# Patient Record
Sex: Female | Born: 1995 | Race: Black or African American | Hispanic: No | Marital: Single | Smoking: Never smoker
Health system: Southern US, Community
[De-identification: ages and names within clinical notes are randomized; demographics above are authoritative.]

---
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
## 2016-05-21 ENCOUNTER — Emergency Department (HOSPITAL_COMMUNITY): Payer: Medicaid Other

## 2016-05-21 ENCOUNTER — Encounter (HOSPITAL_COMMUNITY): Payer: Self-pay

## 2016-05-21 ENCOUNTER — Emergency Department (HOSPITAL_COMMUNITY)
Admission: EM | Admit: 2016-05-21 | Discharge: 2016-05-21 | Disposition: A | Discharge status: Home / Self Care | Payer: Medicaid Other | Attending: Emergency Medicine | Admitting: Emergency Medicine

## 2016-05-21 DIAGNOSIS — N83201 Unspecified ovarian cyst, right side: Secondary | ICD-10-CM | POA: Diagnosis not present

## 2016-05-21 DIAGNOSIS — N83209 Unspecified ovarian cyst, unspecified side: Secondary | ICD-10-CM

## 2016-05-21 DIAGNOSIS — Z79899 Other long term (current) drug therapy: Secondary | ICD-10-CM | POA: Insufficient documentation

## 2016-05-21 DIAGNOSIS — R1030 Lower abdominal pain, unspecified: Secondary | ICD-10-CM | POA: Diagnosis present

## 2016-05-21 DIAGNOSIS — R102 Pelvic and perineal pain: Secondary | ICD-10-CM

## 2016-05-21 LAB — COMPREHENSIVE METABOLIC PANEL
ALBUMIN: 4.2 g/dL (ref 3.5–5.0)
ALT: 17 U/L (ref 14–54)
ANION GAP: 6 (ref 5–15)
AST: 19 U/L (ref 15–41)
Alkaline Phosphatase: 54 U/L (ref 38–126)
BILIRUBIN TOTAL: 0.5 mg/dL (ref 0.3–1.2)
BUN: 8 mg/dL (ref 6–20)
CO2: 28 mmol/L (ref 22–32)
Calcium: 9 mg/dL (ref 8.9–10.3)
Chloride: 103 mmol/L (ref 101–111)
Creatinine, Ser: 0.74 mg/dL (ref 0.44–1.00)
GFR calc non Af Amer: >60 mL/min (ref >60)
GLUCOSE: 93 mg/dL (ref 65–99)
POTASSIUM: 3.9 mmol/L (ref 3.5–5.1)
SODIUM: 137 mmol/L (ref 135–145)
TOTAL PROTEIN: 6.8 g/dL (ref 6.5–8.1)

## 2016-05-21 LAB — CBC
HEMATOCRIT: 37.8 % (ref 36.0–46.0)
HEMOGLOBIN: 12.3 g/dL (ref 12.0–15.0)
MCH: 28.7 pg (ref 26.0–34.0)
MCHC: 32.5 g/dL (ref 30.0–36.0)
MCV: 88.3 fL (ref 78.0–100.0)
Platelets: 262 10*3/uL (ref 150–400)
RBC: 4.28 MIL/uL (ref 3.87–5.11)
RDW: 13.5 % (ref 11.5–15.5)
WBC: 9.6 10*3/uL (ref 4.0–10.5)

## 2016-05-21 LAB — I-STAT BETA HCG BLOOD, ED (MC, WL, AP ONLY)

## 2016-05-21 LAB — URINALYSIS, ROUTINE W REFLEX MICROSCOPIC
BILIRUBIN URINE: NEGATIVE
Glucose, UA: NEGATIVE mg/dL
Hgb urine dipstick: NEGATIVE
Ketones, ur: NEGATIVE mg/dL
Leukocytes, UA: NEGATIVE
NITRITE: NEGATIVE
PH: 8 (ref 5.0–8.0)
Protein, ur: NEGATIVE mg/dL
SPECIFIC GRAVITY, URINE: 1.015 (ref 1.005–1.030)

## 2016-05-21 LAB — WET PREP, GENITAL
CLUE CELLS WET PREP: NONE SEEN
Sperm: NONE SEEN
TRICH WET PREP: NONE SEEN
WBC WET PREP: NONE SEEN
YEAST WET PREP: NONE SEEN

## 2016-05-21 LAB — LIPASE, BLOOD: Lipase: 23 U/L (ref 11–51)

## 2016-05-21 MED ORDER — OXYCODONE HCL 5 MG PO TABS
5.0000 mg | ORAL_TABLET | Freq: Once | ORAL | Status: AC
  Administered 2016-05-21: 5 mg via ORAL
  Filled 2016-05-21: qty 1

## 2016-05-21 MED ORDER — TRAMADOL HCL 50 MG PO TABS: 50.0000 mg | ORAL_TABLET | Freq: Four times a day (QID) | ORAL | 0 refills | Status: AC | PRN

## 2016-05-21 NOTE — Progress Notes (Signed)
entered in d/c instructions [facility]     Medicaid [place] access response hx [facility] [address] [place], [place] [phone]    Next Steps: Schedule an appointment as soon as possible for a visit today    Instructions: OR if you plan to remain in [place] Go to or call local DSS [phone] to get assist with a medicaid doctor locally for follow up care You may also call this number to get assist with medicaid transportation services

## 2016-05-21 NOTE — ED Notes (Signed)
Pt states she voided not long ago and will let us know when she has to void again

## 2016-05-21 NOTE — ED Provider Notes (Signed)
[facility]-EMERGENCY DEPT Provider Note   CSN: [id number] Arrival date & time: 05/21/16  0754     History   Chief Complaint Chief Complaint  Patient presents with  . Abdominal Pain  . Shoulder Pain    HPI Jane Bowers is a 20 y.o. female.  HPI  Pt was seen at 0955.  Per pt, c/o gradual onset and persistence of constant pelvic "pain" since "yesterday afternoon after having sex." Describes the abd pain as "aching." States the pain radiates into her lower back/buttocks area and her right shoulder. Denies N/V, no diarrhea, no fevers, no rash, no CP/SOB, no black or blood in stools, no vaginal bleeding/discharge, no dysuria/hematuria.      History reviewed. No pertinent past medical history.  Patient Active Problem List   Diagnosis Date Noted  . Peroneal tendonitis of right lower extremity 11/28/2015    History reviewed. No pertinent surgical history.    Home Medications    Prior to Admission medications   Medication Sig Start Date End Date Taking? Authorizing Provider  albuterol (PROVENTIL HFA;VENTOLIN HFA) 108 (90 Base) MCG/ACT inhaler Inhale 1-2 puffs into the lungs every 6 (six) hours as needed for wheezing or shortness of breath.    Historical Provider, MD  meloxicam (MOBIC) 15 MG tablet Take 1 tablet (15 mg total) by mouth daily. 11/28/15   Briscoe DeutscherBryan R Hess, DO    Family History History reviewed. No pertinent family history.  Social History Social History  Substance Use Topics  . Smoking status: Never Smoker  . Smokeless tobacco: Never Used  . Alcohol use No     Allergies   Chloraseptic sore throat [acetaminophen]   Review of Systems Review of Systems ROS: Statement: All systems negative except as marked or noted in the HPI; Constitutional: Negative for fever and chills. ; ; Eyes: Negative for eye pain, redness and discharge. ; ; ENMT: Negative for ear pain, hoarseness, nasal congestion, sinus pressure and sore throat. ; ; Cardiovascular: Negative for chest pain,  palpitations, diaphoresis, dyspnea and peripheral edema. ; ; Respiratory: Negative for cough, wheezing and stridor. ; ; Gastrointestinal: Negative for nausea, vomiting, diarrhea, blood in stool, hematemesis, jaundice and rectal bleeding. . ; ; Genitourinary: Negative for dysuria and hematuria. ; ; GYN:  +pelvic pain, no vaginal bleeding, no vaginal discharge, no vulvar pain. ;; Musculoskeletal: +LBP. Negative for neck pain. Negative for swelling and trauma.; ; Skin: Negative for pruritus, rash, abrasions, blisters, bruising and skin lesion.; ; Neuro: Negative for headache, lightheadedness and neck stiffness. Negative for weakness, altered level of consciousness, altered mental status, extremity weakness, paresthesias, involuntary movement, seizure and syncope.       Physical Exam Updated Vital Signs BP 106/75 (BP Location: Left Arm)   Pulse 84   Temp 97.8 F (36.6 C) (Oral)   Resp 16   LMP 04/29/2016   SpO2 100%   Physical Exam 1000: Physical examination:  Nursing notes reviewed; Vital signs and O2 SAT reviewed;  Constitutional: Well developed, Well nourished, Well hydrated, In no acute distress; Head:  Normocephalic, atraumatic; Eyes: EOMI, PERRL, No scleral icterus; ENMT: Mouth and pharynx normal, Mucous membranes moist; Neck: Supple, Full range of motion, No lymphadenopathy; Cardiovascular: Regular rate and rhythm, No murmur, rub, or gallop; Respiratory: Breath sounds clear & equal bilaterally, No rales, rhonchi, wheezes.  Speaking full sentences with ease, Normal respiratory effort/excursion; Chest: Nontender, Movement normal; Abdomen: Soft, +bilat pelvic and suprapubic areas tender to palp. No rebound or guarding. Nondistended, Normal bowel sounds; Genitourinary: No CVA tenderness.  Pelvic exam performed with permission of pt and female ED RN assist during exam.  External genitalia w/o lesions. Vaginal vault with thin white discharge.  Cervix w/o lesions, not friable, GC/chlam and wet prep  obtained and sent to lab.  Bimanual exam w/o CMT and left adnexal tenderness, +uterine and right adnexal tenderness.;; Extremities: Pulses normal, No tenderness, No edema, No calf edema or asymmetry.; Neuro: AA&Ox3, Major CN grossly intact.  Speech clear. No gross focal motor or sensory deficits in extremities.; Skin: Color normal, Warm, Dry.    ED Treatments / Results  Labs (all labs ordered are listed, but only abnormal results are displayed)   EKG  EKG Interpretation None       Radiology   Procedures Procedures (including critical care time)  Medications Ordered in ED Medications - No data to display   Initial Impression / Assessment and Plan / ED Course  I have reviewed the triage vital signs and the nursing notes.  Pertinent labs & imaging results that were available during my care of the patient were reviewed by me and considered in my medical decision making (see chart for details).  MDM Reviewed: previous chart, nursing note and vitals Reviewed previous: labs Interpretation: labs, x-ray and ultrasound   Results for orders placed or performed during the hospital encounter of 05/21/16  Wet prep, genital  Result Value Ref Range   Yeast Wet Prep HPF POC NONE SEEN NONE SEEN   Trich, Wet Prep NONE SEEN NONE SEEN   Clue Cells Wet Prep HPF POC NONE SEEN NONE SEEN   WBC, Wet Prep HPF POC NONE SEEN NONE SEEN   Sperm NONE SEEN   Lipase, blood  Result Value Ref Range   Lipase 23 11 - 51 U/L  Comprehensive metabolic panel  Result Value Ref Range   Sodium 137 135 - 145 mmol/L   Potassium 3.9 3.5 - 5.1 mmol/L   Chloride 103 101 - 111 mmol/L   CO2 28 22 - 32 mmol/L   Glucose, Bld 93 65 - 99 mg/dL   BUN 8 6 - 20 mg/dL   Creatinine, Ser 1.61 0.44 - 1.00 mg/dL   Calcium 9.0 8.9 - 09.6 mg/dL   Total Protein 6.8 6.5 - 8.1 g/dL   Albumin 4.2 3.5 - 5.0 g/dL   AST 19 15 - 41 U/L   ALT 17 14 - 54 U/L   Alkaline Phosphatase 54 38 - 126 U/L   Total Bilirubin 0.5 0.3 - 1.2  mg/dL   GFR calc non Af Amer >60 >60 mL/min   GFR calc Af Amer >60 >60 mL/min   Anion gap 6 5 - 15  CBC  Result Value Ref Range   WBC 9.6 4.0 - 10.5 K/uL   RBC 4.28 3.87 - 5.11 MIL/uL   Hemoglobin 12.3 12.0 - 15.0 g/dL   HCT 04.5 40.9 - 81.1 %   MCV 88.3 78.0 - 100.0 fL   MCH 28.7 26.0 - 34.0 pg   MCHC 32.5 30.0 - 36.0 g/dL   RDW 91.4 78.2 - 95.6 %   Platelets 262 150 - 400 K/uL  Urinalysis, Routine w reflex microscopic  Result Value Ref Range   Color, Urine YELLOW YELLOW   APPearance CLEAR CLEAR   Specific Gravity, Urine 1.015 1.005 - 1.030   pH 8.0 5.0 - 8.0   Glucose, UA NEGATIVE NEGATIVE mg/dL   Hgb urine dipstick NEGATIVE NEGATIVE   Bilirubin Urine NEGATIVE NEGATIVE   Ketones, ur NEGATIVE NEGATIVE mg/dL  Protein, ur NEGATIVE NEGATIVE mg/dL   Nitrite NEGATIVE NEGATIVE   Leukocytes, UA NEGATIVE NEGATIVE  I-Stat beta hCG blood, ED  Result Value Ref Range   I-stat hCG, quantitative <5.0 <5 mIU/mL   Comment 3           US Transvaginal Non-ob Result Date: 05/21/2016 CLINICAL DATA:  Patient with bilateral pelvic pain for 1 day. EXAM: TRANSABDOMINAL AND TRANSVAGINAL ULTRASOUND OF PELVIS DOPPLER ULTRASOUND OF OVARIES TECHNIQUE: Both transabdominal and transvaginal ultrasound examinations of the pelvis were performed. Transabdominal technique was performed for global imaging of the pelvis including uterus, ovaries, adnexal regions, and pelvic cul-de-sac. It was necessary to proceed with endovaginal exam following the transabdominal exam to visualize the adnexal structures. Color and duplex Doppler ultrasound was utilized to evaluate blood flow to the ovaries. COMPARISON:  None. FINDINGS: Uterus Measurements: 7.9 x 3.5 x 3.5 cm. No fibroids or other mass visualized. Endometrium Thickness: 12 mm.  No focal abnormality visualized. Right ovary Measurements: 10.1 x 5.2 x 8.1 cm. The right ovary is enlarged and complex in appearance with multiple areas of heterogeneity. Left ovary  Measurements: 5.4 x 2.6 x 3.7 cm. There is a 3.5 x 2.6 x 3.2 cm simple cyst within the left ovary. Pulsed Doppler evaluation of both ovaries demonstrates normal low-resistance arterial and venous waveforms. Other findings Moderate amount of complicated free fluid. IMPRESSION: The right ovary is markedly enlarged and complex in appearance with multiple areas of mixed echogenicity, suggestive of hemorrhagic cysts which have bled into the ovarian parenchyma/adnexa. Blood flow is demonstrated within the right ovary. Given the overall size of the right ovary and complexity, intermittent torsion as a cause of pain is not excluded. Patient needs follow-up pelvic ultrasound in 4- 6 weeks to assess for interval change/resolution of large complex probable hemorrhagic cystic masses within the right ovary. Left ovarian simple cyst. These results were called by telephone at the time of interpretation on 05/21/2016 at 12:08 pm to Dr. Samuel Jester , who verbally acknowledged these results. Electronically Signed   By: Annia Belt M.D.   On: 05/21/2016 12:09   Dg Abd Acute W/chest Result Date: 05/21/2016 CLINICAL DATA:  Pelvic pain beginning at 4 p.m. yesterday after intercourse. EXAM: DG ABDOMEN ACUTE W/ 1V CHEST COMPARISON:  None. FINDINGS: There is no evidence of dilated bowel loops or free intraperitoneal air. No radiopaque calculi or other significant radiographic abnormality is seen. Heart size and mediastinal contours are within normal limits. Both lungs are clear. IMPRESSION: Negative abdominal radiographs.  No acute cardiopulmonary disease. Electronically Signed   By: Drusilla Kanner M.D.   On: 05/21/2016 10:43    1305:  [place] completed. T/C to OB/GYN Dr. Debroah Loop, case discussed, including:  HPI, pertinent PM/SHx, VS/PE, dx testing, ED course and treatment: no emergent surgical issue at this time (ovary has blood flow on doppler), pain control, [facility].   1400:  Workup now complete; otherwise  reassuring. Dx and testing, as well as d/w OB/GYN, d/w pt.  Questions answered.  Verb understanding, agreeable to d/c home with outpt f/u.    Final Clinical Impressions(s) / ED Diagnoses   Final diagnoses:  Pelvic pain in female  Pelvic pain in female    New Prescriptions New Prescriptions   No medications on file     Samuel Jester, DO 05/23/16 2038

## 2016-05-21 NOTE — ED Notes (Signed)
US at bedside

## 2016-05-21 NOTE — Discharge Instructions (Signed)
Take the prescription as directed.  Call the OB/GYN doctor today to schedule a follow up appointment within the next 4 days.  Return to the Emergency Department immediately sooner if worsening.

## 2016-05-21 NOTE — ED Triage Notes (Signed)
Pt c/o lower abdominal radiating into lower back/buttcocks area and R shoulder pain starting last night.  Pain score 7/10.  Denies n/v/d.  Denies injury.  Pt reports taking ibuprofen last night w/o relief.  Sts the abdominal pain began just below umbilicus, but has "moved" to a generalized pain.

## 2016-05-21 NOTE — ED Notes (Signed)
Patient transported to Ultrasound 

## 2016-05-22 LAB — GC/CHLAMYDIA PROBE AMP (~~LOC~~) NOT AT ARMC
CHLAMYDIA, DNA PROBE: NEGATIVE
NEISSERIA GONORRHEA: NEGATIVE

## 2016-05-27 ENCOUNTER — Telehealth (HOSPITAL_COMMUNITY): Payer: Self-pay | Admitting: *Deleted

## 2016-06-09 ENCOUNTER — Telehealth: Payer: Self-pay

## 2016-06-09 DIAGNOSIS — N83209 Unspecified ovarian cyst, unspecified side: Secondary | ICD-10-CM

## 2016-06-09 NOTE — Telephone Encounter (Signed)
:  Per Provider patient needs to have an u/s appt scheduled in 4-6 weeks prior to coming in for her appointment.Marland Kitchen. Appointment has been  scheduled for 06/16/2016 @ 8:45. Patient has been given appointment  information.

## 2016-06-11 ENCOUNTER — Encounter: Payer: PRIVATE HEALTH INSURANCE | Admitting: Obstetrics and Gynecology

## 2016-06-16 ENCOUNTER — Ambulatory Visit (HOSPITAL_COMMUNITY)
Admission: RE | Admit: 2016-06-16 | Discharge: 2016-06-16 | Disposition: A | Discharge status: Home / Self Care | Payer: PRIVATE HEALTH INSURANCE | Source: Ambulatory Visit | Attending: Obstetrics & Gynecology | Admitting: Obstetrics & Gynecology

## 2016-06-16 DIAGNOSIS — N83292 Other ovarian cyst, left side: Secondary | ICD-10-CM | POA: Insufficient documentation

## 2016-06-16 DIAGNOSIS — N83201 Unspecified ovarian cyst, right side: Secondary | ICD-10-CM | POA: Insufficient documentation

## 2016-06-16 DIAGNOSIS — N83209 Unspecified ovarian cyst, unspecified side: Secondary | ICD-10-CM

## 2016-06-16 DIAGNOSIS — R102 Pelvic and perineal pain: Secondary | ICD-10-CM | POA: Insufficient documentation

## 2016-06-18 ENCOUNTER — Telehealth: Payer: Self-pay | Admitting: *Deleted

## 2016-06-18 DIAGNOSIS — N83201 Unspecified ovarian cyst, right side: Secondary | ICD-10-CM

## 2016-06-18 NOTE — Telephone Encounter (Addendum)
Pt left message requesting US results.   10/20  1130  Called pt and informed her of US results from 10/17 showing improvement of the Rt ovarian cyst. Dr. Macon LargeAnyanwu recommends repeat US in 6 weeks to assess for changes in the cyst.  Pt agrees to plan of care and US appt given for 11/28 @ 1300.

## 2016-07-08 ENCOUNTER — Encounter: Payer: PRIVATE HEALTH INSURANCE | Admitting: Obstetrics and Gynecology

## 2016-07-28 ENCOUNTER — Ambulatory Visit (HOSPITAL_COMMUNITY): Payer: PRIVATE HEALTH INSURANCE | Attending: Obstetrics & Gynecology

## 2017-04-12 IMAGING — US US ART/VEN ABD/PELV/SCROTUM DOPPLER LTD
1 series · 13 of 25 positions shown · non-contrast
Comparison: None.

CLINICAL DATA: Patient with bilateral pelvic pain for 1 day.

EXAM:
TRANSABDOMINAL AND TRANSVAGINAL ULTRASOUND OF PELVIS
DOPPLER ULTRASOUND OF OVARIES
TECHNIQUE: Both transabdominal and transvaginal ultrasound examinations of the
pelvis were performed. Transabdominal technique was performed for
global imaging of the pelvis including uterus, ovaries, adnexal
regions, and pelvic cul-de-sac.
It was necessary to proceed with endovaginal exam following the
transabdominal exam to visualize the adnexal structures. Color and
duplex Doppler ultrasound was utilized to evaluate blood flow to the
ovaries.

[Series 1: us art/ven abd/pelv/scrotum doppler ltd · 0.23mm/px · 13 of 98 slices shown]
[im 1/98]
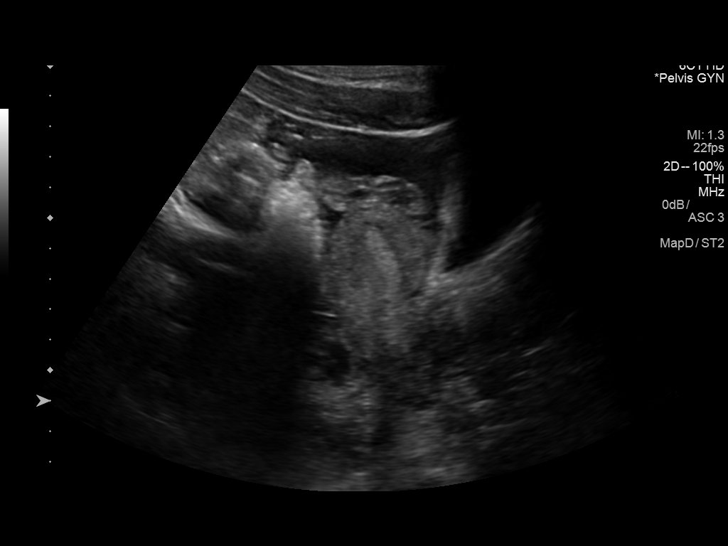
[im 9/98]
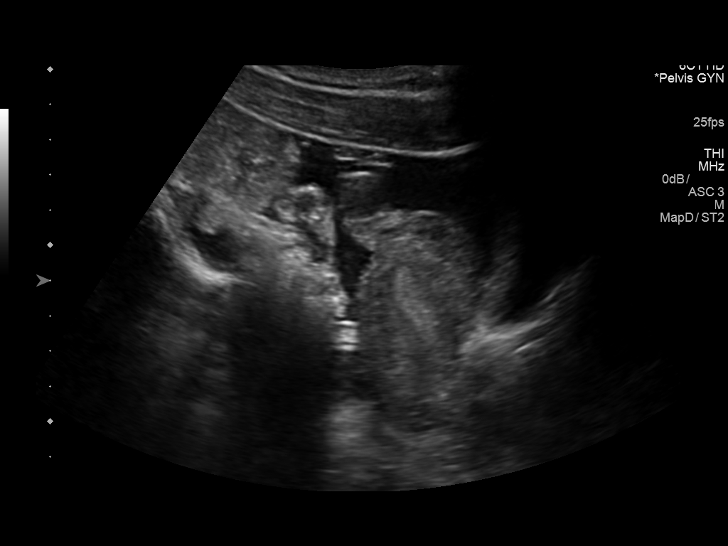
[im 17/98]
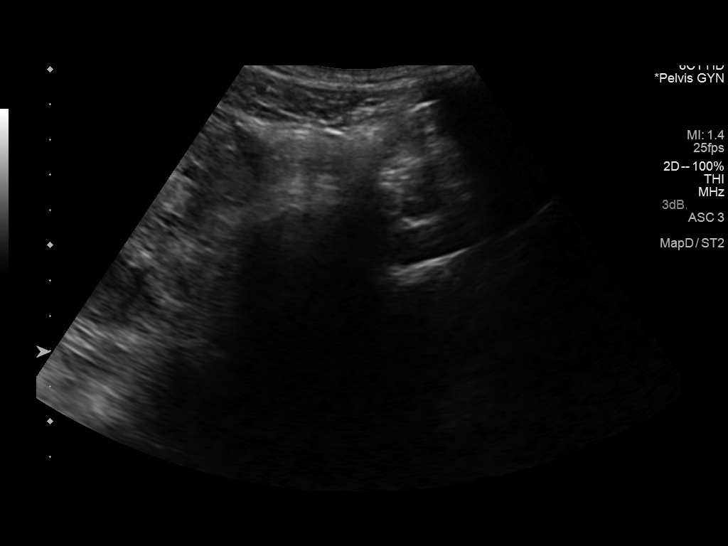
[im 25/98]
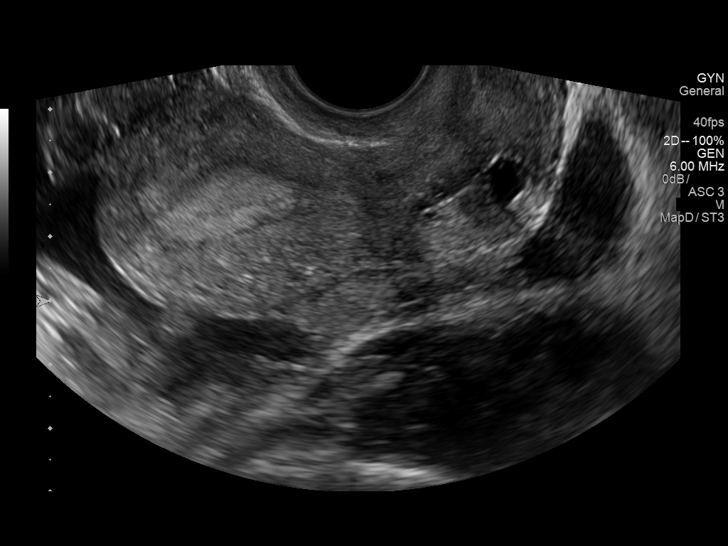
[im 33/98]
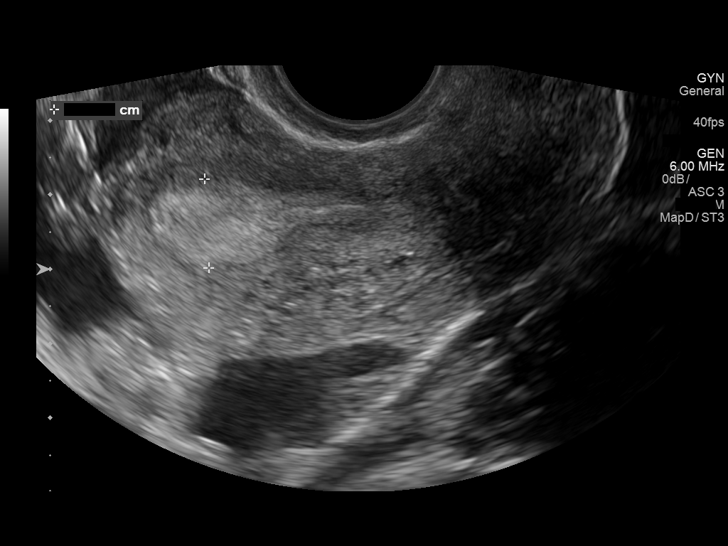
[im 41/98]
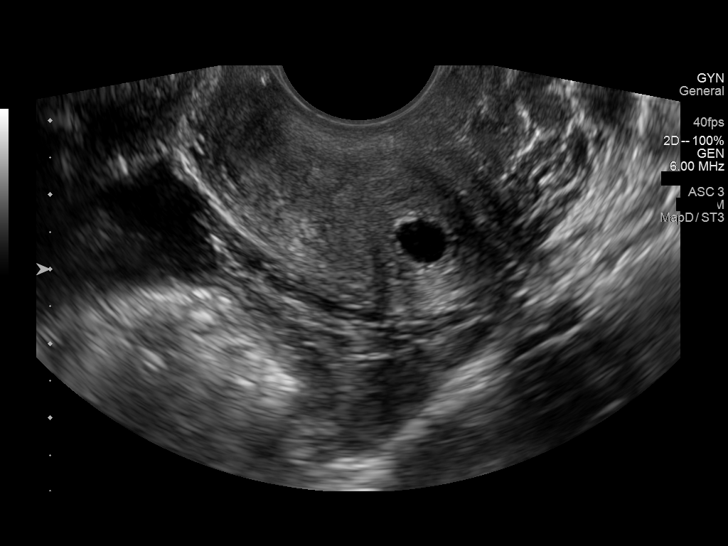
[im 49/98]
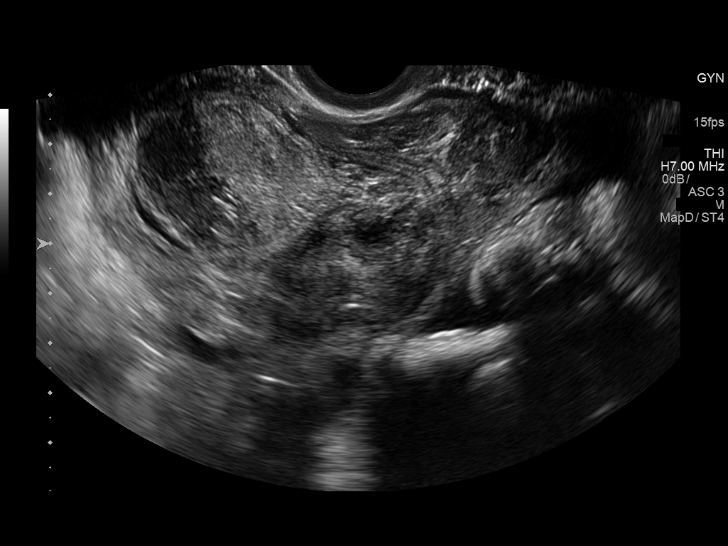
[im 57/98]
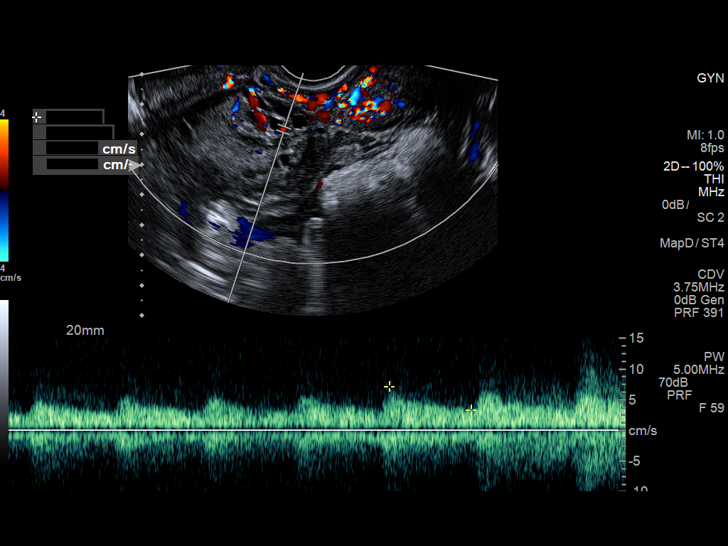
[im 65/98]
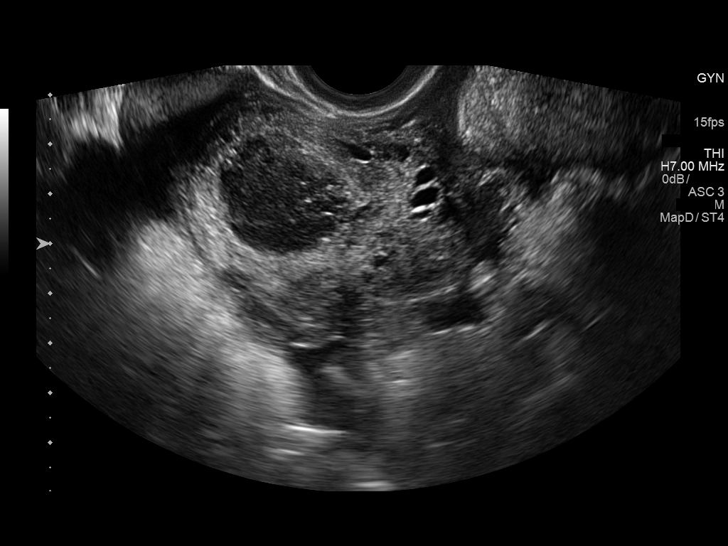
[im 73/98]
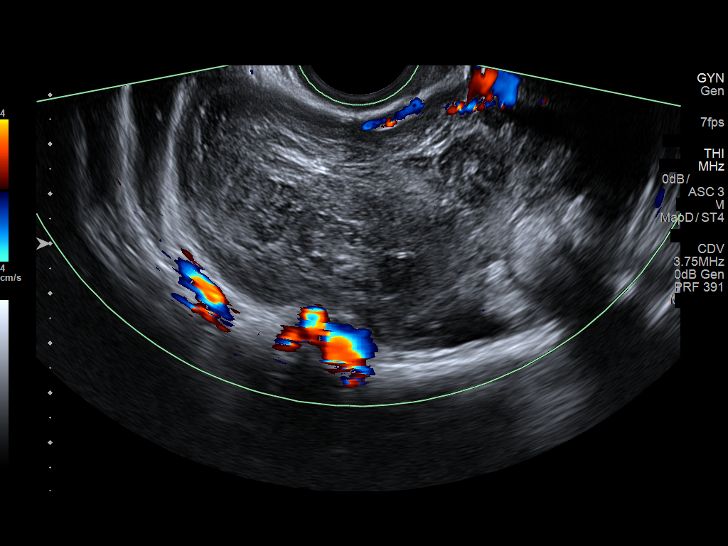
[im 81/98]
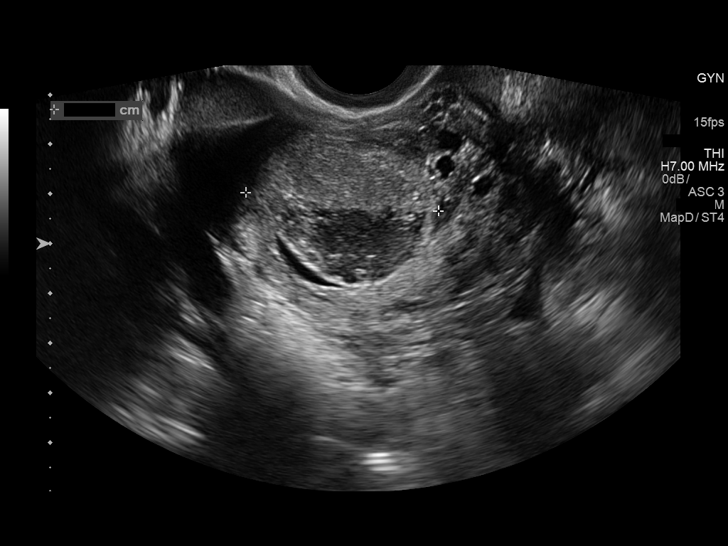
[im 89/98]
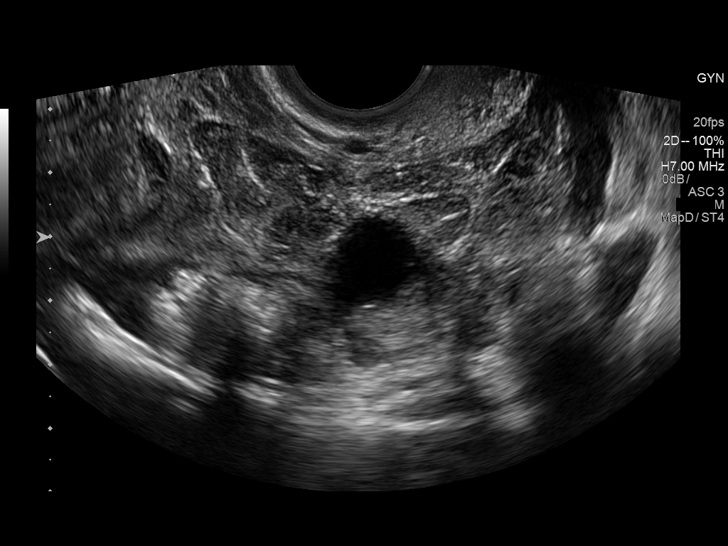
[im 98/98]
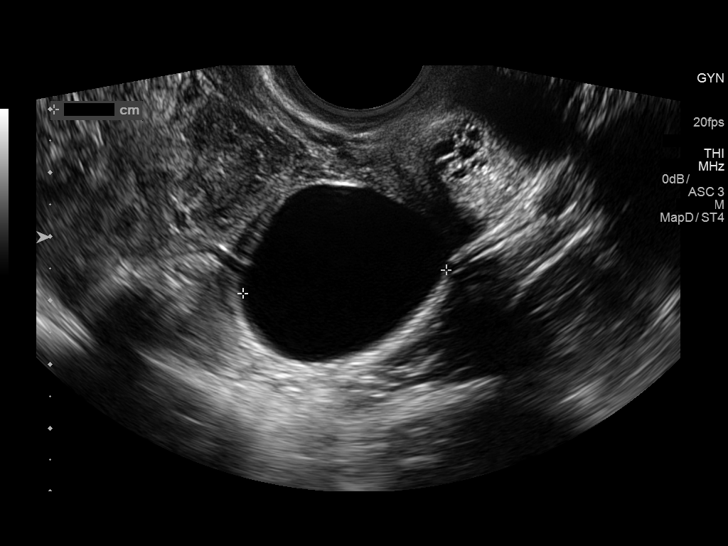

[13 of 25 positions shown; findings below may reference images not displayed]

FINDINGS: Uterus

Measurements: 7.9 x 3.5 x 3.5 cm. No fibroids or other mass
visualized.

Endometrium

Thickness: 12 mm.  No focal abnormality visualized.

Right ovary

Measurements: 10.1 x 5.2 x 8.1 cm. The right ovary is enlarged and
complex in appearance with multiple areas of heterogeneity.

Left ovary

Measurements: 5.4 x 2.6 x 3.7 cm. There is a 3.5 x 2.6 x 3.2 cm
simple cyst within the left ovary.

Pulsed Doppler evaluation of both ovaries demonstrates normal
low-resistance arterial and venous waveforms.

Other findings

Moderate amount of complicated free fluid.
IMPRESSION: The right ovary is markedly enlarged and complex in appearance with
multiple areas of mixed echogenicity, suggestive of hemorrhagic
cysts which have bled into the ovarian parenchyma/adnexa. Blood flow
is demonstrated within the right ovary. Given the overall size of
the right ovary and complexity, intermittent torsion as a cause of
pain is not excluded. Patient needs follow-up pelvic ultrasound in
4- 6 weeks to assess for interval change/resolution of large complex
probable hemorrhagic cystic masses within the right ovary.

Left ovarian simple cyst.

These results were called by telephone at the time of interpretation
on 05/21/2016 at [DATE] to Dr. SHAPARRITO KAHLON , who verbally
acknowledged these results.
